# Patient Record
Sex: Male | Born: 1980 | Race: Black or African American | Hispanic: No | Marital: Single | Smoking: Current every day smoker
Health system: Southern US, Community
[De-identification: ages and names within clinical notes are randomized; demographics above are authoritative.]

## PROBLEM LIST (undated history)

## (undated) DIAGNOSIS — E119 Type 2 diabetes mellitus without complications: Secondary | ICD-10-CM

---

## 2014-06-21 ENCOUNTER — Emergency Department (HOSPITAL_COMMUNITY)
Admission: EM | Admit: 2014-06-21 | Discharge: 2014-06-22 | Disposition: A | Discharge status: Home / Self Care | Payer: Self-pay | Attending: Emergency Medicine | Admitting: Emergency Medicine

## 2014-06-21 ENCOUNTER — Encounter (HOSPITAL_COMMUNITY): Payer: Self-pay | Admitting: Emergency Medicine

## 2014-06-21 ENCOUNTER — Emergency Department (HOSPITAL_COMMUNITY): Payer: Self-pay

## 2014-06-21 DIAGNOSIS — Y9389 Activity, other specified: Secondary | ICD-10-CM | POA: Insufficient documentation

## 2014-06-21 DIAGNOSIS — W108XXA Fall (on) (from) other stairs and steps, initial encounter: Secondary | ICD-10-CM | POA: Insufficient documentation

## 2014-06-21 DIAGNOSIS — S63601A Unspecified sprain of right thumb, initial encounter: Secondary | ICD-10-CM | POA: Insufficient documentation

## 2014-06-21 DIAGNOSIS — Y9289 Other specified places as the place of occurrence of the external cause: Secondary | ICD-10-CM | POA: Insufficient documentation

## 2014-06-21 DIAGNOSIS — Z720 Tobacco use: Secondary | ICD-10-CM | POA: Insufficient documentation

## 2014-06-21 DIAGNOSIS — R040 Epistaxis: Secondary | ICD-10-CM | POA: Insufficient documentation

## 2014-06-21 DIAGNOSIS — Y998 Other external cause status: Secondary | ICD-10-CM | POA: Insufficient documentation

## 2014-06-21 DIAGNOSIS — S01511A Laceration without foreign body of lip, initial encounter: Secondary | ICD-10-CM | POA: Insufficient documentation

## 2014-06-21 MED ORDER — TRAMADOL HCL 50 MG PO TABS
50.0000 mg | ORAL_TABLET | Freq: Four times a day (QID) | ORAL | Status: AC | PRN
Start: 2014-06-21 — End: ?

## 2014-06-21 MED ORDER — OXYCODONE-ACETAMINOPHEN 5-325 MG PO TABS
1.0000 | ORAL_TABLET | Freq: Once | ORAL | Status: AC
  Administered 2014-06-21: 1 via ORAL
  Filled 2014-06-21: qty 1

## 2014-06-21 NOTE — ED Provider Notes (Signed)
CSN: [id number]     Arrival date & time 06/21/14  2126 History   First MD Initiated Contact with Patient 06/21/14 2211     Chief Complaint  Patient presents with  . Fall     (Consider location/radiation/quality/duration/timing/severity/associated sxs/prior Treatment) HPI Pt is a 34yo male brought to ED by girlfriend after pt reportedly tripped and fell down 20 concrete steps at apartment complex. Pt does report drinking alcohol tonight.  Denies passing out prior to trip and fall. Girlfriend states she believes pt lost consciousness as after pt fell down steps he hit his head on a door and did not move for several seconds.  Pt has been repeatedly asking questions to things girlfriend states pt should know.  Pt denies head, neck, back, chest, abdominal, arm or leg pain. Pt is c/o severe right thumb pain. Pt states he thinks he broke his right thumb. Denies nausea or vomiting.  Denies use of illicit drugs. Denies daily medications. No significant PMH.    History reviewed. No pertinent past medical history. History reviewed. No pertinent past surgical history. No family history on file. History  Substance Use Topics  . Smoking status: Current Every Day Smoker  . Smokeless tobacco: Not on file  . Alcohol Use: Yes    Review of Systems  Constitutional: Negative for fever and chills.  Respiratory: Negative for cough and shortness of breath.   Cardiovascular: Negative for chest pain and palpitations.  Gastrointestinal: Negative for nausea, vomiting and abdominal pain.  Musculoskeletal: Positive for myalgias and arthralgias. Negative for back pain, gait problem, neck pain and neck stiffness.       Right thumb pain  Skin: Positive for wound.  Neurological: Negative for dizziness, seizures, syncope, weakness, light-headedness, numbness and headaches.  All other systems reviewed and are negative.     Allergies  Review of patient's allergies indicates no known allergies.  Home Medications    Prior to Admission medications   Medication Sig Start Date End Date Taking? Authorizing Provider  traMADol (ULTRAM) 50 MG tablet Take 1 tablet (50 mg total) by mouth every 6 (six) hours as needed. 06/21/14   Junius FinnerErin O'Malley, PA-C   BP 125/86 mmHg  Pulse 109  Temp(Src) 97.9 F (36.6 C) (Oral)  Resp 24  Wt 230 lb (104.327 kg)  SpO2 100% Physical Exam  Constitutional: He is oriented to person, place, and time. He appears well-developed and well-nourished.  c-collar in place. NAD  HENT:  Head: Normocephalic. Head is with abrasion ( right cheek. no active bleeding) and with laceration ( right upper lip. scant dried blood.).  Nose: Epistaxis ( right nare, bleeding controlled) is observed.  Mouth/Throat: Uvula is midline, oropharynx is clear and moist and mucous membranes are normal.  Eyes: Conjunctivae are normal. No scleral icterus.  Neck: Normal range of motion. Neck supple.  No midline bone tenderness, no crepitus or step-offs.  Cardiovascular: Normal rate, regular rhythm and normal heart sounds.   Pulmonary/Chest: Effort normal and breath sounds normal. No respiratory distress. He has no wheezes. He has no rales. He exhibits no tenderness.  Abdominal: Soft. Bowel sounds are normal. He exhibits no distension and no mass. There is no tenderness. There is no rebound and no guarding.  Musculoskeletal: He exhibits tenderness. He exhibits no edema.  Right hand: no obvious deformity.  Tenderness over thenar muscle and proximal thumb. Unable to flex or extend thumb due to pain.  FROM index, middle, ring and little finger.   FROM right wrist.   No  midline spinal tenderness. FROM arms and legs.  Neurological: He is alert and oriented to person, place, and time. No cranial nerve deficit. Coordination normal. GCS eye subscore is 4. GCS verbal subscore is 5. GCS motor subscore is 6.  Alert and oriented to person, place, time and event. Speech is fluent. Normal coordination. No ataxia. 5/5 strength in  upper and lower extremities bilaterally.   Skin: Skin is warm and dry.  Nursing note and vitals reviewed.   ED Course  Procedures (including critical care time) Labs Review Labs Reviewed - No data to display  Imaging Review Ct Head Wo Contrast  06/21/2014   CLINICAL DATA:  fall down 20 concrete steps at apartment complex, right cheek laceration  EXAM: CT HEAD WITHOUT CONTRAST  CT CERVICAL SPINE WITHOUT CONTRAST  TECHNIQUE: Multidetector CT imaging of the head and cervical spine was performed following the standard protocol without intravenous contrast. Multiplanar CT image reconstructions of the cervical spine were also generated.  COMPARISON:  None.  FINDINGS: CT HEAD FINDINGS  No intracranial hemorrhage. No parenchymal contusion. No midline shift or mass effect. Basilar cisterns are patent. No skull base fracture. No fluid in the paranasal sinuses or mastoid air cells. Orbits are normal.  CT CERVICAL SPINE FINDINGS  No prevertebral soft tissue swelling. Normal alignment of cervical vertebral bodies. No loss of vertebral body height. Normal facet articulation. Normal craniocervical junction.  No evidence epidural or paraspinal hematoma.  IMPRESSION: 1. No intracranial trauma. 2. No cervical spine fracture   Electronically Signed   By: Genevive Bi M.D.   On: 06/21/2014 23:31   Ct Cervical Spine Wo Contrast  06/21/2014   CLINICAL DATA:  fall down 20 concrete steps at apartment complex, right cheek laceration  EXAM: CT HEAD WITHOUT CONTRAST  CT CERVICAL SPINE WITHOUT CONTRAST  TECHNIQUE: Multidetector CT imaging of the head and cervical spine was performed following the standard protocol without intravenous contrast. Multiplanar CT image reconstructions of the cervical spine were also generated.  COMPARISON:  None.  FINDINGS: CT HEAD FINDINGS  No intracranial hemorrhage. No parenchymal contusion. No midline shift or mass effect. Basilar cisterns are patent. No skull base fracture. No fluid in the  paranasal sinuses or mastoid air cells. Orbits are normal.  CT CERVICAL SPINE FINDINGS  No prevertebral soft tissue swelling. Normal alignment of cervical vertebral bodies. No loss of vertebral body height. Normal facet articulation. Normal craniocervical junction.  No evidence epidural or paraspinal hematoma.  IMPRESSION: 1. No intracranial trauma. 2. No cervical spine fracture   Electronically Signed   By: Genevive Bi M.D.   On: 06/21/2014 23:31   Dg Hand Complete Right  06/21/2014   CLINICAL DATA:  Larey Seat downstairs, right hand pain. Pain near the first digit  EXAM: RIGHT HAND - COMPLETE 3+ VIEW  COMPARISON:  None.  FINDINGS: No evidence of fracture of the carpal or metacarpal bones. Radiocarpal joint is intact. Phalanges are normal. No soft tissue injury.  IMPRESSION: No fracture or dislocation.   Electronically Signed   By: Genevive Bi M.D.   On: 06/21/2014 23:17     EKG Interpretation None      MDM   Final diagnoses:  Fall down stairs, initial encounter  Lip laceration, initial encounter  Thumb sprain, right, initial encounter   Pt is a 34yo male presenting to ED c/o right thumb pain after fall down 20 concrete steps at apartment complex.  Girlfriend reports pt lost consciousness and has had "memory lapses"  On exam, pt  is alert & oriented to person, place, time, and event. Denies head or neck pain, however, with reports of retrograde amnesia per girlfriend as well as reported MOI, CT head and cervical spine ordered. Right hand: tenderness to proximal thumb and thenar muscle. No snuff box tenderness. Plain films: no fracture or dislocation.   CT head and cervical spine: negative for acute injury. Pt hemodynamically stable for discharge home.  Wrist splint provided for right thumb sprain.   Home care instructions for lip laceration provided. Return precautions provided. Pt verbalized understanding and agreement with tx plan.     Junius Finner, PA-C 06/21/14 2347  Toy Baker, MD 06/24/14 [phone]

## 2014-06-21 NOTE — ED Notes (Signed)
Pt presents with girlfriend, per girlfriend she witnessed him trip and fall down 20 concrete steps at apartment complex. Girlfriend states he is having "memory lapses" abrasion noted to R cheek, laceration to upper lip, teeth intact but painful per patient, R thumb pain with abrasion. Pt A & O, philly collar placed in triage.

## 2014-08-27 ENCOUNTER — Encounter (HOSPITAL_COMMUNITY): Payer: Self-pay | Admitting: Emergency Medicine

## 2014-08-27 ENCOUNTER — Emergency Department (HOSPITAL_COMMUNITY)
Admission: EM | Admit: 2014-08-27 | Discharge: 2014-08-27 | Disposition: A | Discharge status: Home / Self Care | Payer: 59 | Attending: Emergency Medicine | Admitting: Emergency Medicine

## 2014-08-27 DIAGNOSIS — I10 Essential (primary) hypertension: Secondary | ICD-10-CM | POA: Diagnosis not present

## 2014-08-27 DIAGNOSIS — Z79899 Other long term (current) drug therapy: Secondary | ICD-10-CM | POA: Diagnosis not present

## 2014-08-27 DIAGNOSIS — E119 Type 2 diabetes mellitus without complications: Secondary | ICD-10-CM | POA: Diagnosis present

## 2014-08-27 DIAGNOSIS — Z720 Tobacco use: Secondary | ICD-10-CM | POA: Insufficient documentation

## 2014-08-27 HISTORY — DX: Type 2 diabetes mellitus without complications: E11.9

## 2014-08-27 LAB — COMPREHENSIVE METABOLIC PANEL
ALT: 14 U/L (ref 0–53)
ANION GAP: 7 (ref 5–15)
AST: 15 U/L (ref 0–37)
Albumin: 5 g/dL (ref 3.5–5.2)
Alkaline Phosphatase: 78 U/L (ref 39–117)
BUN: 10 mg/dL (ref 6–23)
CHLORIDE: 94 mmol/L — AB (ref 96–112)
CO2: 28 mmol/L (ref 19–32)
Calcium: 9.7 mg/dL (ref 8.4–10.5)
Creatinine, Ser: 1 mg/dL (ref 0.50–1.35)
Glucose, Bld: 479 mg/dL — ABNORMAL HIGH (ref 70–99)
POTASSIUM: 3.9 mmol/L (ref 3.5–5.1)
Sodium: 129 mmol/L — ABNORMAL LOW (ref 135–145)
Total Bilirubin: 0.7 mg/dL (ref 0.3–1.2)
Total Protein: 8.4 g/dL — ABNORMAL HIGH (ref 6.0–8.3)

## 2014-08-27 LAB — URINALYSIS, ROUTINE W REFLEX MICROSCOPIC
Bilirubin Urine: NEGATIVE
Glucose, UA: >1000 mg/dL — AB
Hgb urine dipstick: NEGATIVE
KETONES UR: NEGATIVE mg/dL
LEUKOCYTES UA: NEGATIVE
NITRITE: NEGATIVE
Protein, ur: NEGATIVE mg/dL
Specific Gravity, Urine: 1.042 — ABNORMAL HIGH (ref 1.005–1.030)
Urobilinogen, UA: 0.2 mg/dL (ref 0.0–1.0)
pH: 5.5 (ref 5.0–8.0)

## 2014-08-27 LAB — CBC WITH DIFFERENTIAL/PLATELET
BASOS ABS: 0 10*3/uL (ref 0.0–0.1)
Basophils Relative: 0 % (ref 0–1)
Eosinophils Absolute: 0.1 10*3/uL (ref 0.0–0.7)
Eosinophils Relative: 1 % (ref 0–5)
HEMATOCRIT: 45.7 % (ref 39.0–52.0)
Hemoglobin: 16.4 g/dL (ref 13.0–17.0)
Lymphocytes Relative: 31 % (ref 12–46)
Lymphs Abs: 1.6 10*3/uL (ref 0.7–4.0)
MCH: 28.2 pg (ref 26.0–34.0)
MCHC: 35.9 g/dL (ref 30.0–36.0)
MCV: 78.7 fL (ref 78.0–100.0)
Monocytes Absolute: 0.2 10*3/uL (ref 0.1–1.0)
Monocytes Relative: 4 % (ref 3–12)
NEUTROS ABS: 3.4 10*3/uL (ref 1.7–7.7)
NEUTROS PCT: 64 % (ref 43–77)
Platelets: 212 10*3/uL (ref 150–400)
RBC: 5.81 MIL/uL (ref 4.22–5.81)
RDW: 12.8 % (ref 11.5–15.5)
WBC: 5.3 10*3/uL (ref 4.0–10.5)

## 2014-08-27 LAB — URINE MICROSCOPIC-ADD ON

## 2014-08-27 LAB — CBG MONITORING, ED
GLUCOSE-CAPILLARY: 463 mg/dL — AB (ref 70–99)
GLUCOSE-CAPILLARY: 331 mg/dL — AB (ref 70–99)

## 2014-08-27 LAB — LIPASE, BLOOD: LIPASE: 19 U/L (ref 11–59)

## 2014-08-27 MED ORDER — SODIUM CHLORIDE 0.9 % IV BOLUS (SEPSIS)
1000.0000 mL | Freq: Once | INTRAVENOUS | Status: AC
  Administered 2014-08-27: 1000 mL via INTRAVENOUS

## 2014-08-27 MED ORDER — METFORMIN HCL 500 MG PO TABS: 500.0000 mg | ORAL_TABLET | Freq: Two times a day (BID) | ORAL | Status: AC

## 2014-08-27 MED ORDER — AMLODIPINE BESYLATE 5 MG PO TABS: 5.0000 mg | ORAL_TABLET | Freq: Every day | ORAL | Status: DC

## 2014-08-27 NOTE — ED Notes (Signed)
Pt sent from PCP for new diagnosis of diabetes. Given insulin at 1200. Has had polydipsia and polyuria at home. No n/v/d.

## 2014-08-27 NOTE — ED Provider Notes (Addendum)
CSN: [id number]     Arrival date & time 08/27/14  1316 History   First MD Initiated Contact with Patient 08/27/14 1551     Chief Complaint  Patient presents with  . Diabetes    HPI Comments: Pt has been noticing increased thirst and polyuria.  Pt has lost 40 lbs in the last year as well.  He was eating healthier but this weight was more than expected.  He has been drinking a lot of sugary drinks. He went to the doctors office today.  His blood sugar tested high.  He was given 12 units of humalog at the office.  20 minutes later the reading was still high so he was sent to the ED.  Patient is a 34 y.o. male presenting with diabetes problem. The history is provided by the patient.  Diabetes This is a new problem. The problem occurs constantly. Pertinent negatives include no chest pain, no abdominal pain, no headaches and no shortness of breath.    Past Medical History  Diagnosis Date  . Diabetes mellitus without complication    History reviewed. No pertinent past surgical history. History reviewed. No pertinent family history. History  Substance Use Topics  . Smoking status: Current Every Day Smoker  . Smokeless tobacco: Not on file  . Alcohol Use: Yes    Review of Systems  Respiratory: Negative for shortness of breath.   Cardiovascular: Negative for chest pain.  Gastrointestinal: Negative for abdominal pain.  Neurological: Negative for headaches.  All other systems reviewed and are negative.     Allergies  Review of patient's allergies indicates no known allergies.  Home Medications   Prior to Admission medications   Medication Sig Start Date End Date Taking? Authorizing Provider  ibuprofen (ADVIL,MOTRIN) 200 MG tablet Take 400 mg by mouth every 6 (six) hours as needed for headache or moderate pain.   Yes Historical Provider, MD  Multiple Vitamin (MULTIVITAMIN WITH MINERALS) TABS tablet Take 1 tablet by mouth daily.   Yes Historical Provider, MD  Tetrahydrozoline HCl  (VISINE OP) Apply 2 drops to eye daily as needed (dry eyes).   Yes Historical Provider, MD  amLODipine (NORVASC) 5 MG tablet Take 1 tablet (5 mg total) by mouth daily. 08/27/14   Linwood DibblesJon Barnaby Rippeon, MD  metFORMIN (GLUCOPHAGE) 500 MG tablet Take 1 tablet (500 mg total) by mouth 2 (two) times daily with a meal. 08/27/14   Linwood DibblesJon Dyane Broberg, MD  traMADol (ULTRAM) 50 MG tablet Take 1 tablet (50 mg total) by mouth every 6 (six) hours as needed. Patient not taking: Reported on 08/27/2014 06/21/14   Junius FinnerErin O'Malley, PA-C   BP 149/110 mmHg  Pulse 108  Temp(Src) 98.2 F (36.8 C) (Oral)  Resp 18  SpO2 98% Physical Exam  Constitutional: He appears well-developed and well-nourished. No distress.  HENT:  Head: Normocephalic and atraumatic.  Right Ear: External ear normal.  Left Ear: External ear normal.  Eyes: Conjunctivae are normal. Right eye exhibits no discharge. Left eye exhibits no discharge. No scleral icterus.  Neck: Neck supple. No tracheal deviation present.  Cardiovascular: Normal rate, regular rhythm and intact distal pulses.   Pulmonary/Chest: Effort normal and breath sounds normal. No stridor. No respiratory distress. He has no wheezes. He has no rales.  Abdominal: Soft. Bowel sounds are normal. He exhibits no distension. There is no tenderness. There is no rebound and no guarding.  Musculoskeletal: He exhibits no edema or tenderness.  Neurological: He is alert. He has normal strength. No cranial nerve deficit (  no facial droop, extraocular movements intact, no slurred speech) or sensory deficit. He exhibits normal muscle tone. He displays no seizure activity. Coordination normal.  Skin: Skin is warm and dry. No rash noted.  Psychiatric: He has a normal mood and affect.  Nursing note and vitals reviewed.   ED Course  Procedures (including critical care time) Labs Review Labs Reviewed  COMPREHENSIVE METABOLIC PANEL - Abnormal; Notable for the following:    Sodium 129 (*)    Chloride 94 (*)    Glucose,  Bld 479 (*)    Total Protein 8.4 (*)    All other components within normal limits  URINALYSIS, ROUTINE W REFLEX MICROSCOPIC - Abnormal; Notable for the following:    Specific Gravity, Urine 1.042 (*)    Glucose, UA >1000 (*)    All other components within normal limits  CBG MONITORING, ED - Abnormal; Notable for the following:    Glucose-Capillary 463 (*)    All other components within normal limits  CBG MONITORING, ED - Abnormal; Notable for the following:    Glucose-Capillary 331 (*)    All other components within normal limits  LIPASE, BLOOD  CBC WITH DIFFERENTIAL/PLATELET  URINE MICROSCOPIC-ADD ON    Medications  sodium chloride 0.9 % bolus 1,000 mL (1,000 mLs Intravenous New Bag/Given 08/27/14 1651)     MDM   Final diagnoses:  Essential hypertension  Type 2 diabetes mellitus without complication    Pt is feeling well without acute complaints.  He does have new onset diabetes.  PCP was going to start him on insulin but was concerned about his persistently elevated blood sugar.  I will consult with medical service to get their recommendations regarding outpatient treatment but pt does not need hospitalization at this time.  I discussed dietary modification and close follow up with PCP.  Will dc patient home on low dose norvasc and metformin bid.    Linwood Dibbles, MD 08/27/14 1802  Repeat BP was normal.  Will stop the norvasc.    Linwood Dibbles, MD 08/27/14 [phone]

## 2014-08-27 NOTE — Discharge Instructions (Signed)
Diabetes and Exercise Exercising regularly is important. It is not just about losing weight. It has many health benefits, such as:  Improving your overall fitness, flexibility, and endurance.  Increasing your bone density.  Helping with weight control.  Decreasing your body fat.  Increasing your muscle strength.  Reducing stress and tension.  Improving your overall health. People with diabetes who exercise gain additional benefits because exercise:  Reduces appetite.  Improves the body's use of blood sugar (glucose).  Helps lower or control blood glucose.  Decreases blood pressure.  Helps control blood lipids (such as cholesterol and triglycerides).  Improves the body's use of the hormone insulin by:  Increasing the body's insulin sensitivity.  Reducing the body's insulin needs.  Decreases the risk for heart disease because exercising:  Lowers cholesterol and triglycerides levels.  Increases the levels of good cholesterol (such as high-density lipoproteins [HDL]) in the body.  Lowers blood glucose levels. YOUR ACTIVITY PLAN  Choose an activity that you enjoy and set realistic goals. Your health care provider or diabetes educator can help you make an activity plan that works for you. Exercise regularly as directed by your health care provider. This includes:  Performing resistance training twice a week such as push-ups, sit-ups, lifting weights, or using resistance bands.  Performing 150 minutes of cardio exercises each week such as walking, running, or playing sports.  Staying active and spending no more than 90 minutes at one time being inactive. Even short bursts of exercise are good for you. Three 10-minute sessions spread throughout the day are just as beneficial as a single 30-minute session. Some exercise ideas include:  Taking the dog for a walk.  Taking the stairs instead of the elevator.  Dancing to your favorite song.  Doing an exercise  video.  Doing your favorite exercise with a friend. RECOMMENDATIONS FOR EXERCISING WITH TYPE 1 OR TYPE 2 DIABETES   Check your blood glucose before exercising. If blood glucose levels are greater than 240 mg/dL, check for urine ketones. Do not exercise if ketones are present.  Avoid injecting insulin into areas of the body that are going to be exercised. For example, avoid injecting insulin into:  The arms when playing tennis.  The legs when jogging.  Keep a record of:  Food intake before and after you exercise.  Expected peak times of insulin action.  Blood glucose levels before and after you exercise.  The type and amount of exercise you have done.  Review your records with your health care provider. Your health care provider will help you to develop guidelines for adjusting food intake and insulin amounts before and after exercising.  If you take insulin or oral hypoglycemic agents, watch for signs and symptoms of hypoglycemia. They include:  Dizziness.  Shaking.  Sweating.  Chills.  Confusion.  Drink plenty of water while you exercise to prevent dehydration or heat stroke. Body water is lost during exercise and must be replaced.  Talk to your health care provider before starting an exercise program to make sure it is safe for you. Remember, almost any type of activity is better than none. Document Released: 08/21/2003 Document Revised: 10/15/2013 Document Reviewed: 11/07/2012 Exit[employer] Patient Information 2015 [employer]. This information is not intended to replace advice given to you by your health care provider. Make sure you discuss any questions you have with your health care provider. Diabetes Mellitus and Food It is important for you to manage your blood sugar (glucose) level. Your blood glucose level can   be greatly affected by what you eat. Eating healthier foods in the appropriate amounts throughout the day at about the same time each day will help you  control your blood glucose level. It can also help slow or prevent worsening of your diabetes mellitus. Healthy eating may even help you improve the level of your blood pressure and reach or maintain a healthy weight.  HOW CAN FOOD AFFECT ME? Carbohydrates Carbohydrates affect your blood glucose level more than any other type of food. Your dietitian will help you determine how many carbohydrates to eat at each meal and teach you how to count carbohydrates. Counting carbohydrates is important to keep your blood glucose at a healthy level, especially if you are using insulin or taking certain medicines for diabetes mellitus. Alcohol Alcohol can cause sudden decreases in blood glucose (hypoglycemia), especially if you use insulin or take certain medicines for diabetes mellitus. Hypoglycemia can be a life-threatening condition. Symptoms of hypoglycemia (sleepiness, dizziness, and disorientation) are similar to symptoms of having too much alcohol.  If your health care provider has given you approval to drink alcohol, do so in moderation and use the following guidelines:  Women should not have more than one drink per day, and men should not have more than two drinks per day. One drink is equal to:  12 oz of beer.  5 oz of wine.  1 oz of hard liquor.  Do not drink on an empty stomach.  Keep yourself hydrated. Have water, diet soda, or unsweetened iced tea.  Regular soda, juice, and other mixers might contain a lot of carbohydrates and should be counted. WHAT FOODS ARE NOT RECOMMENDED? As you make food choices, it is important to remember that all foods are not the same. Some foods have fewer nutrients per serving than other foods, even though they might have the same number of calories or carbohydrates. It is difficult to get your body what it needs when you eat foods with fewer nutrients. Examples of foods that you should avoid that are high in calories and carbohydrates but low in nutrients  include:  Trans fats (most processed foods list trans fats on the Nutrition Facts label).  Regular soda.  Juice.  Candy.  Sweets, such as cake, pie, doughnuts, and cookies.  Fried foods. WHAT FOODS CAN I EAT? Have nutrient-rich foods, which will nourish your body and keep you healthy. The food you should eat also will depend on several factors, including:  The calories you need.  The medicines you take.  Your weight.  Your blood glucose level.  Your blood pressure level.  Your cholesterol level. You also should eat a variety of foods, including:  Protein, such as meat, poultry, fish, tofu, nuts, and seeds (lean animal proteins are best).  Fruits.  Vegetables.  Dairy products, such as milk, cheese, and yogurt (low fat is best).  Breads, grains, pasta, cereal, rice, and beans.  Fats such as olive oil, trans fat-free margarine, canola oil, avocado, and olives. DOES EVERYONE WITH DIABETES MELLITUS HAVE THE SAME MEAL PLAN? Because every person with diabetes mellitus is different, there is not one meal plan that works for everyone. It is very important that you meet with a dietitian who will help you create a meal plan that is just right for you. Document Released: 02/25/2005 Document Revised: 06/05/2013 Document Reviewed: 04/27/2013 Exit[employer] Patient Information 2015 [employer]. This information is not intended to replace advice given to you by your health care provider. Make sure you discuss any   questions you have with your health care provider.  

## 2014-08-27 NOTE — ED Notes (Signed)
[facility] [phone]). Pt is a new patient there, strong family hx of DM. Pt reports weight loss over past year, urine +1 ketones, CBG fasting reported "high".  Pt given 12 units humalog, 20 minutes later CBG still reporting "high".

## 2016-11-30 IMAGING — CR DG HAND COMPLETE 3+V*R*
3 series · 3 of 3 positions shown · non-contrast
Comparison: None.

CLINICAL DATA: Fell downstairs, right hand pain. Pain near the
first digit

EXAM:
RIGHT HAND - COMPLETE 3+ VIEW

[x hand pa right]
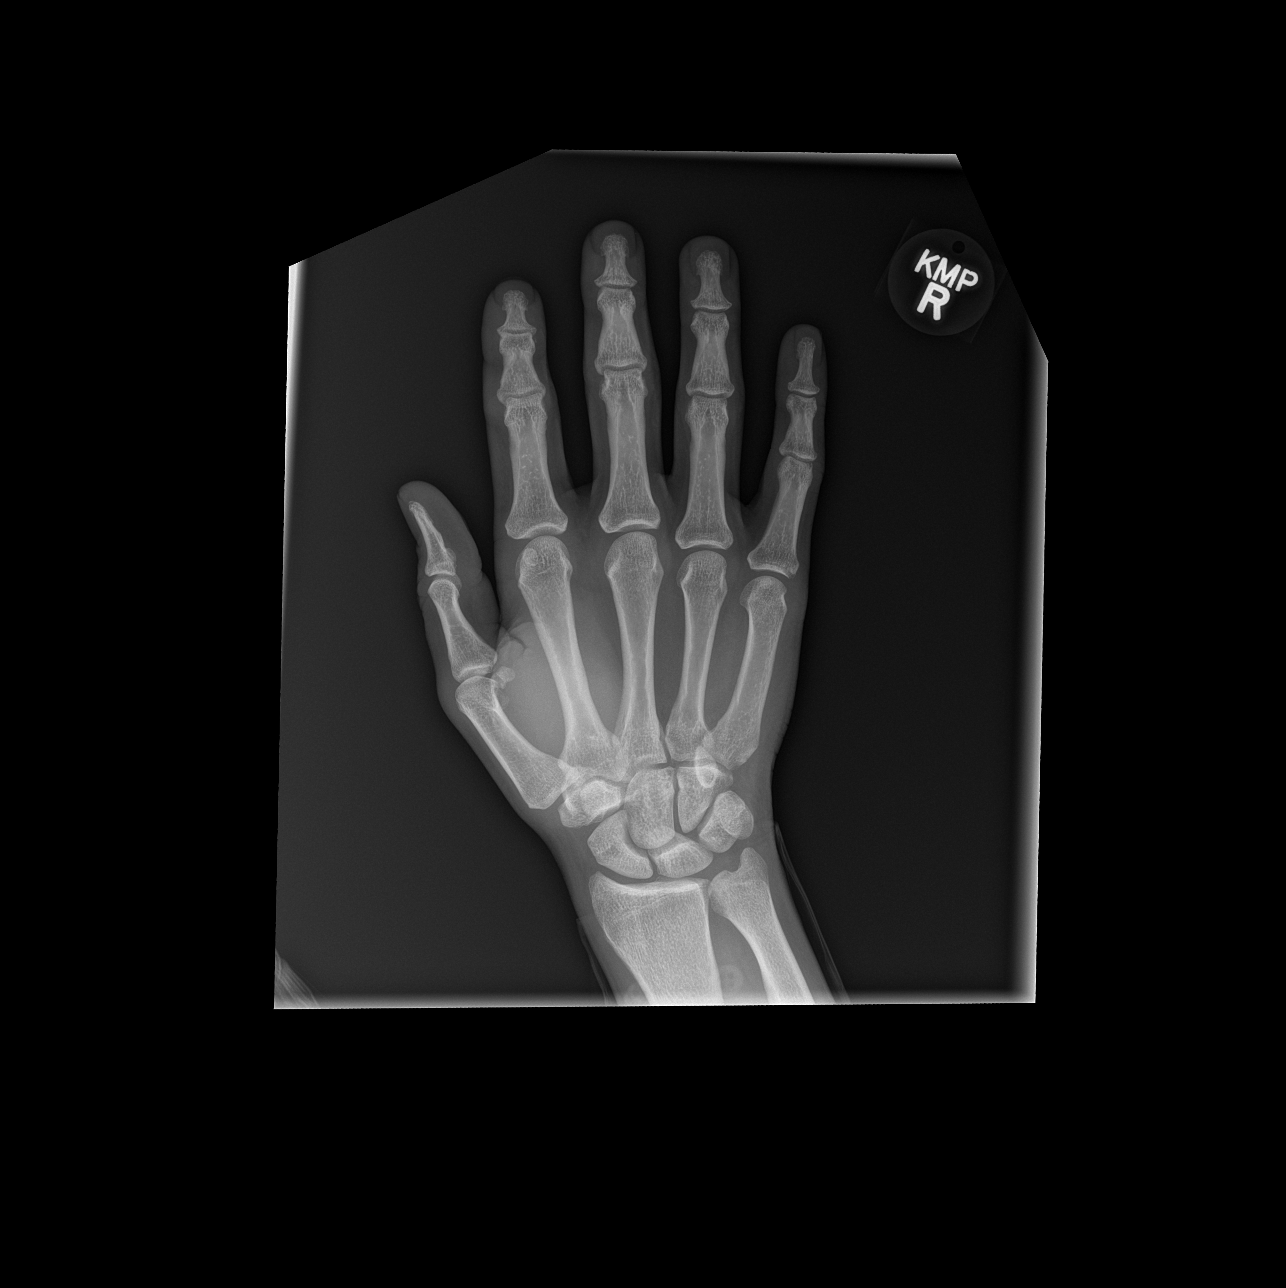

[x hand obl right]
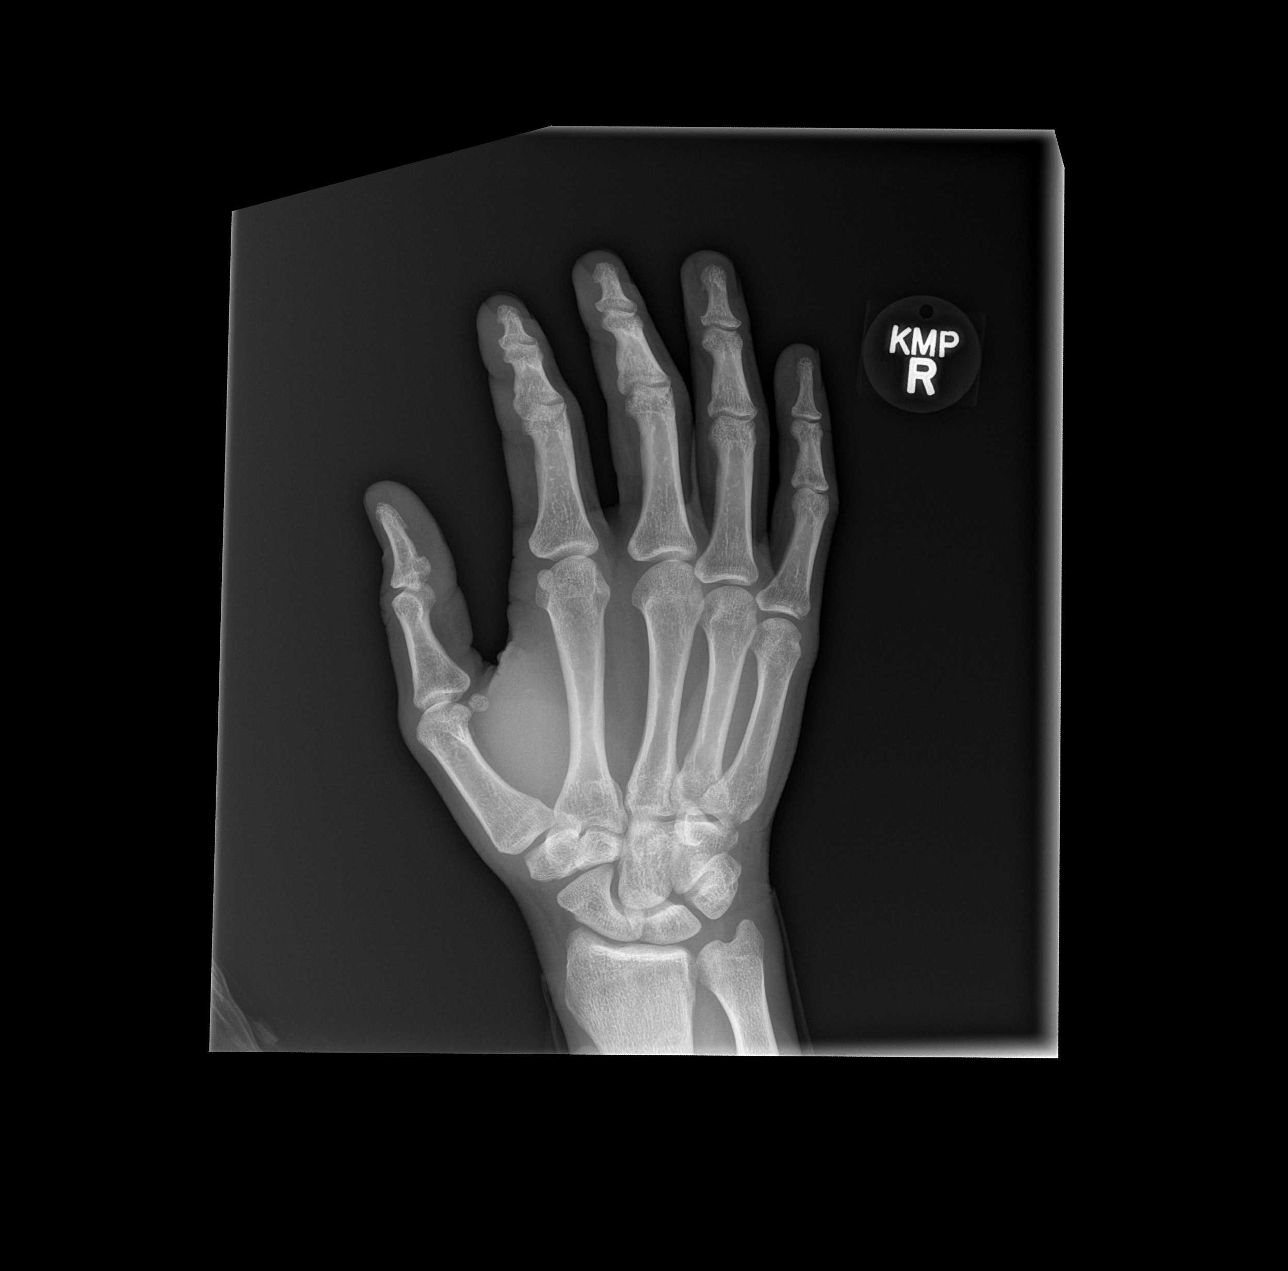

[x hand lat right]
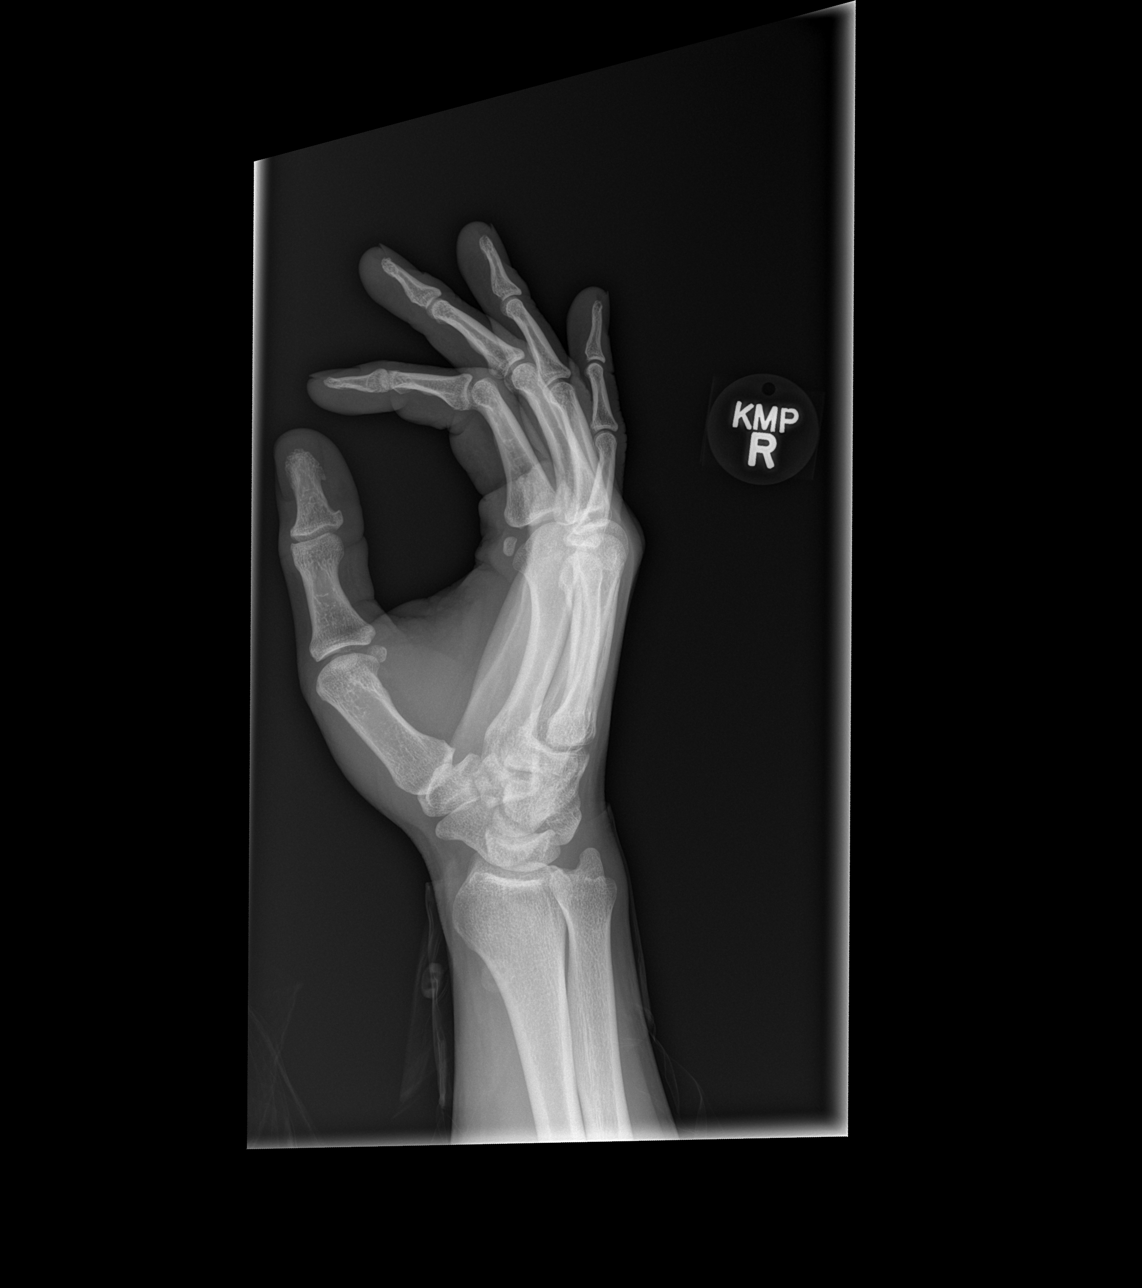

[3 of 3 positions shown; findings below may reference images not displayed]

FINDINGS: No evidence of fracture of the carpal or metacarpal bones.
Radiocarpal joint is intact. Phalanges are normal. No soft tissue
injury.
IMPRESSION: No fracture or dislocation.

## 2016-11-30 IMAGING — CT CT HEAD W/O CM
3 of 4 series · 16 of 30 positions shown, 19 images · non-contrast
Comparison: None.

CLINICAL DATA: fall down 20 concrete steps at apartment complex,
right cheek laceration

EXAM:
CT HEAD WITHOUT CONTRAST
CT CERVICAL SPINE WITHOUT CONTRAST
TECHNIQUE: Multidetector CT imaging of the head and cervical spine was
performed following the standard protocol without intravenous
contrast. Multiplanar CT image reconstructions of the cervical spine
were also generated.

[Series 4: bone windows · axial · 0.45mm/px · z∈[+1242,+1332]mm · 4 of 51 slices shown]
[im 11/51  bone]
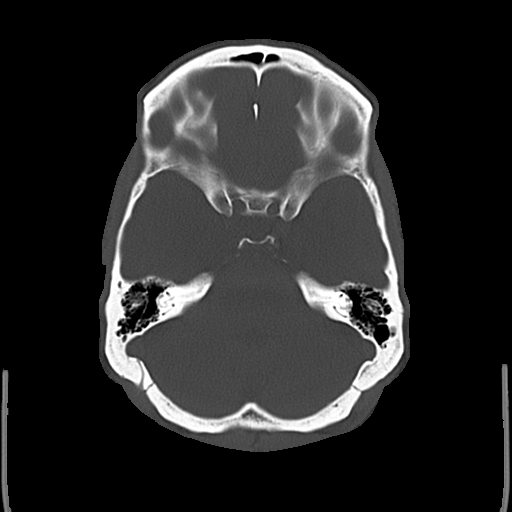
[im 21/51  bone]
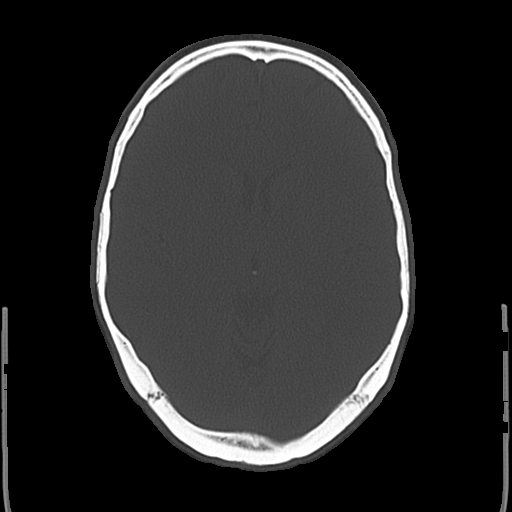
[im 31/51  bone]
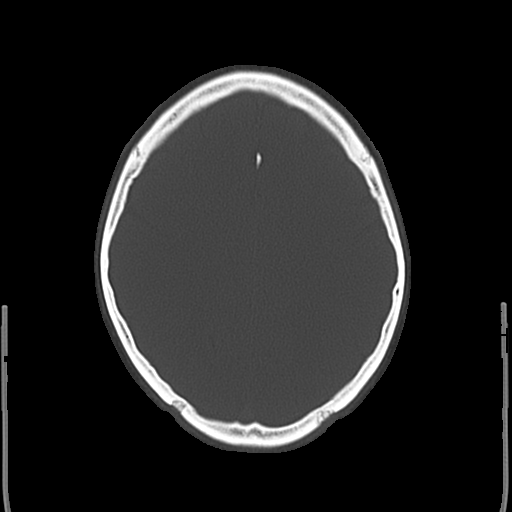
[im 41/51  bone]
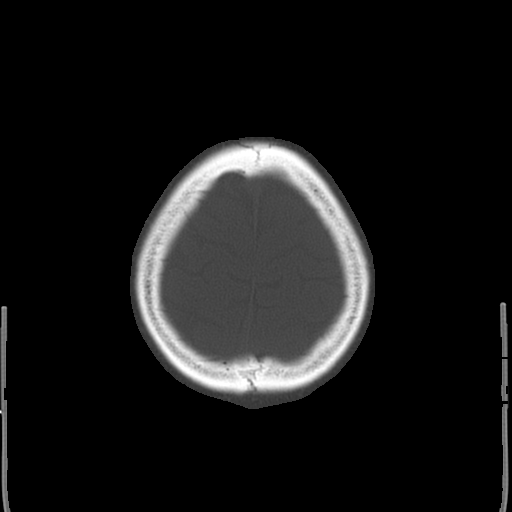

[Series 5: c-spine st · axial · 0.25mm/px · z∈[+1082,+1122]mm · 3 of 98 slices shown]
[im 10/98  brain]
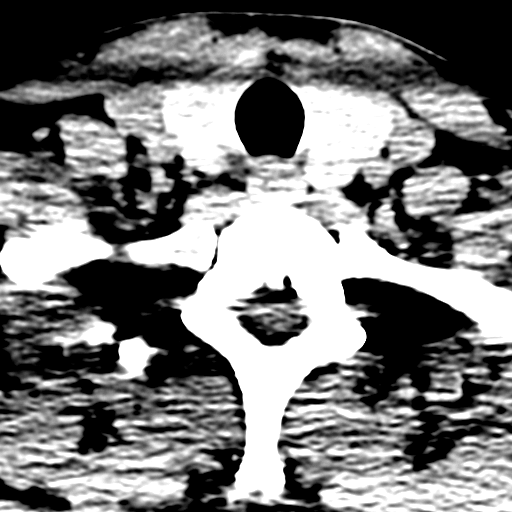
[im 20/98  brain]
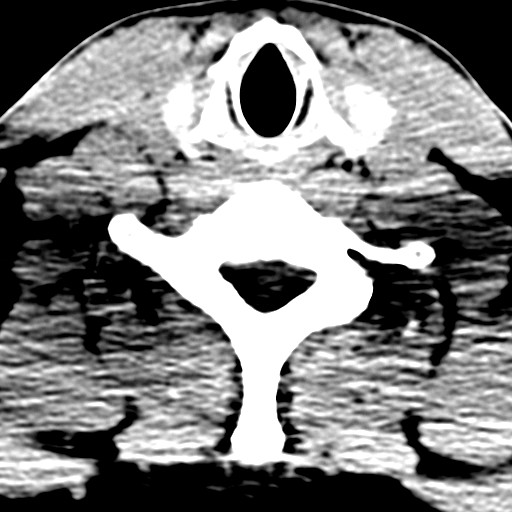
[im 30/98  brain]
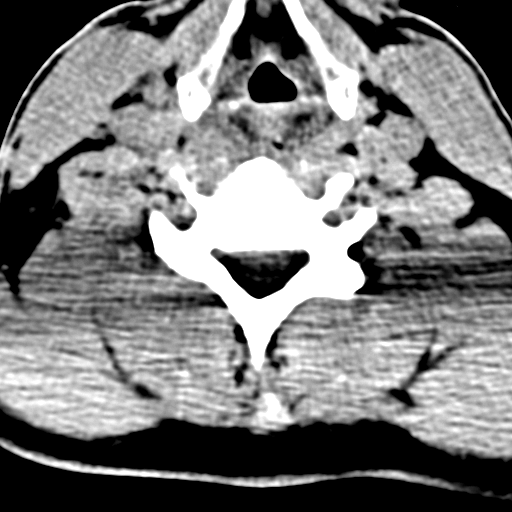

[Series 8: axial reformats · axial · 0.23mm/px · z∈[+1063,+1213]mm · 9 of 97 slices shown, 12 images]
[im 10/97  brain]
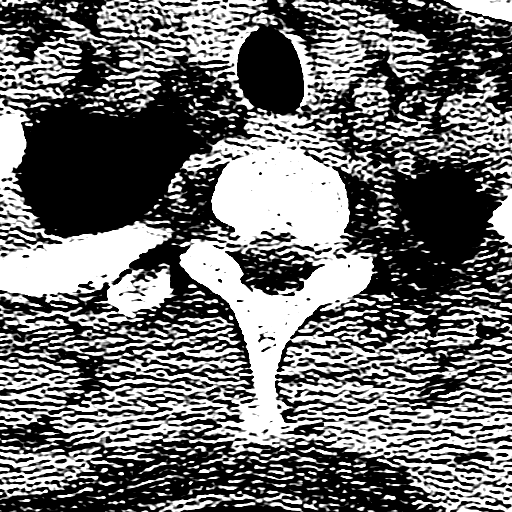
[im 10/97  bone]
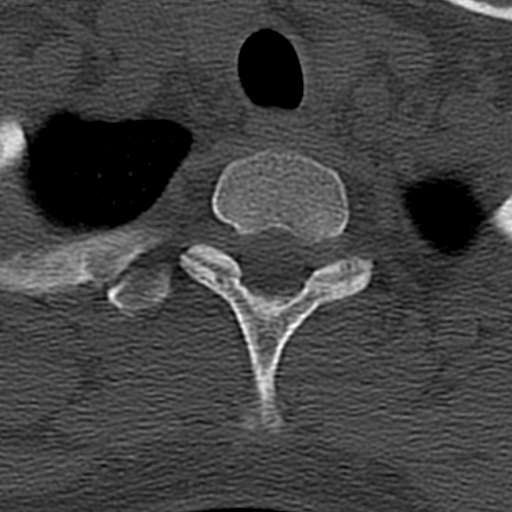
[im 20/97  brain]
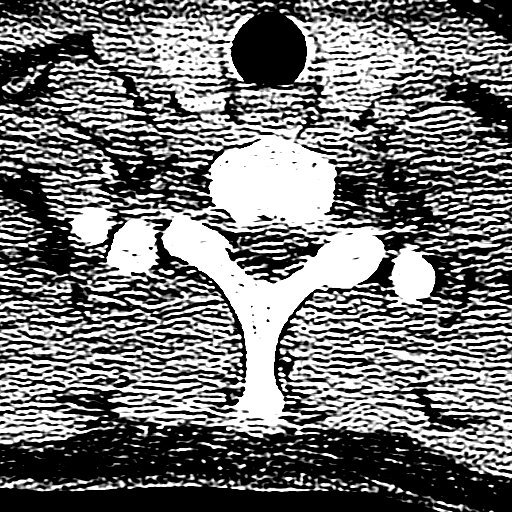
[im 29/97  brain]
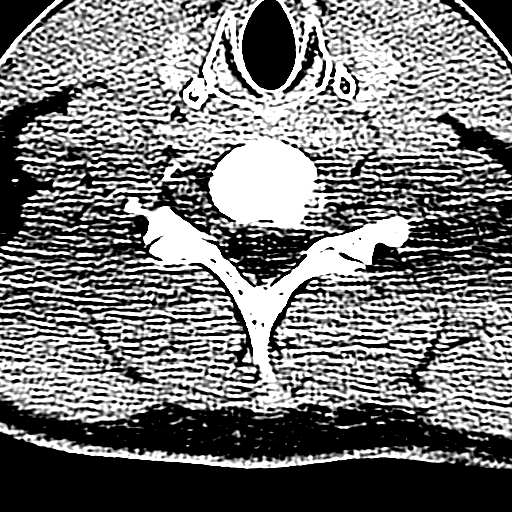
[im 39/97  brain]
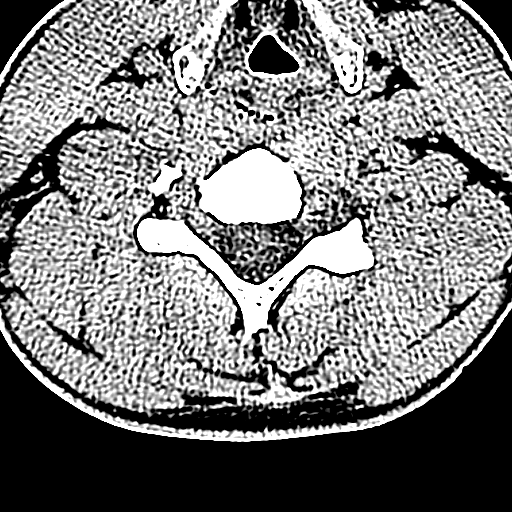
[im 49/97  brain]
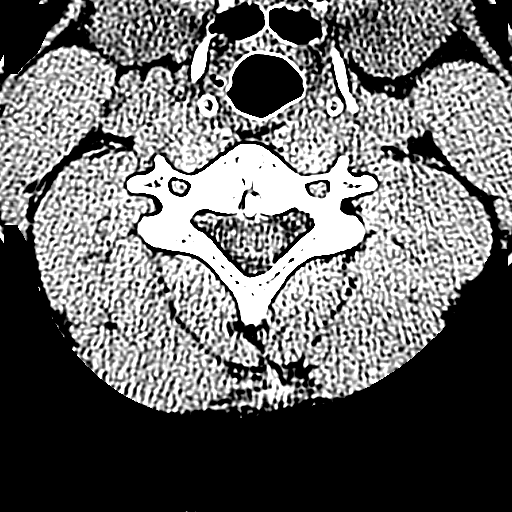
[im 49/97  bone]
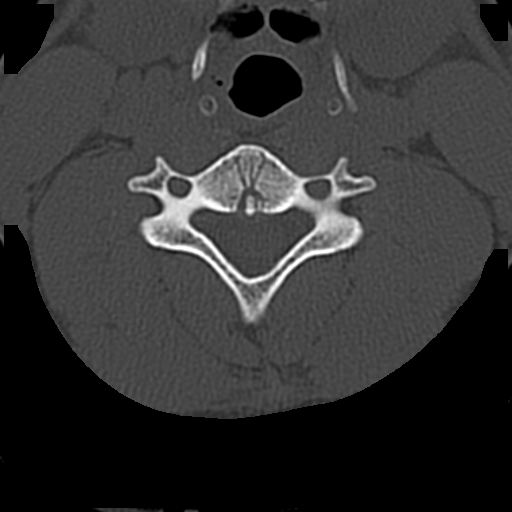
[im 58/97  brain]
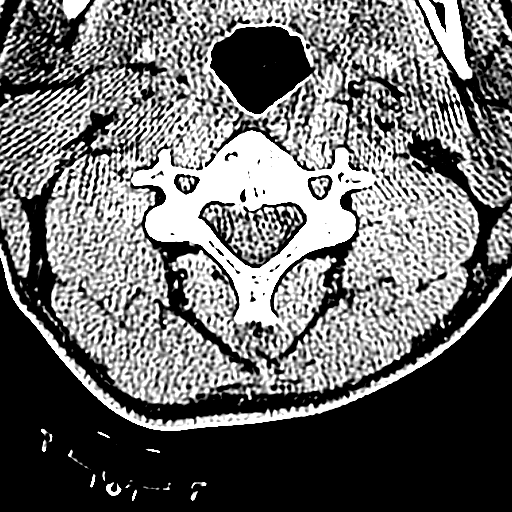
[im 68/97  brain]
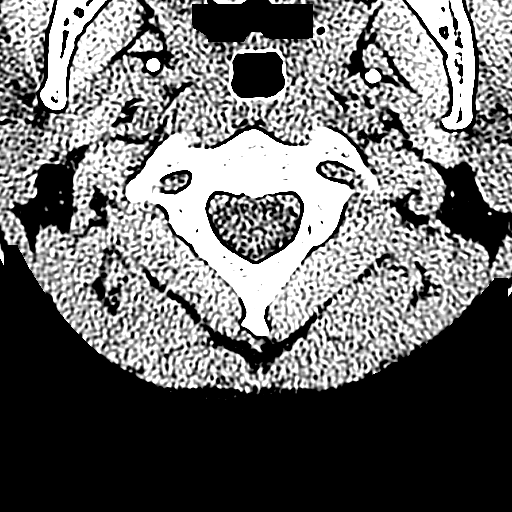
[im 77/97  brain]
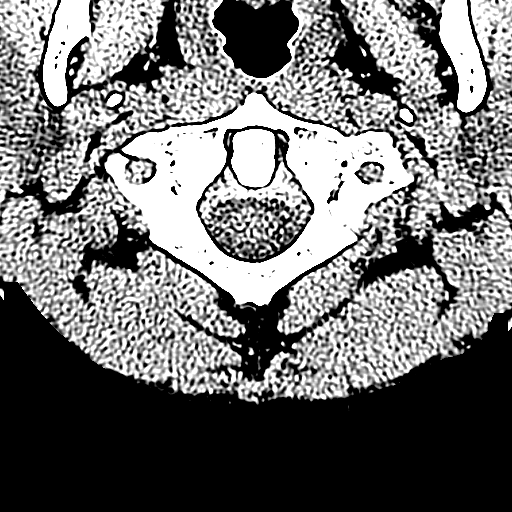
[im 87/97  brain]
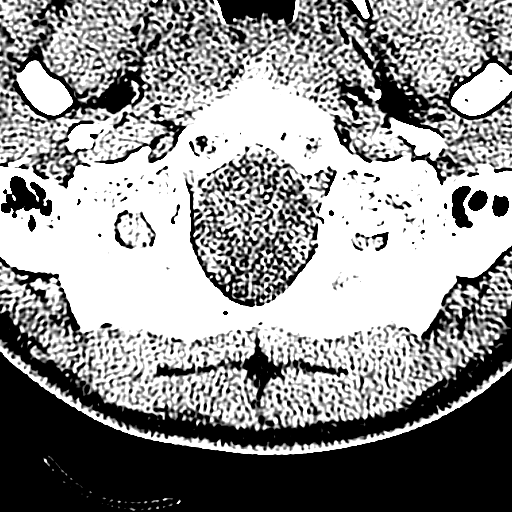
[im 87/97  bone]
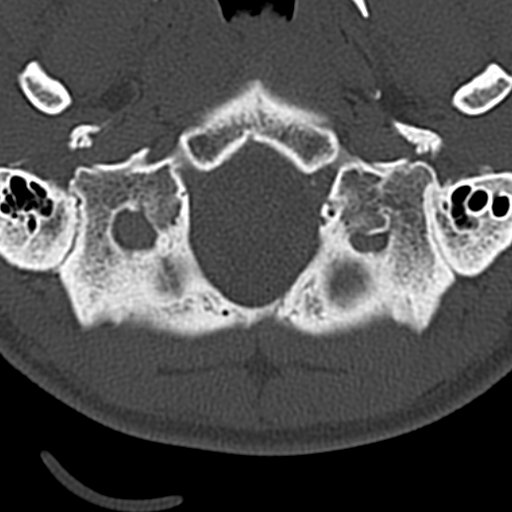

[16 of 30 positions shown; findings below may reference images not displayed]

FINDINGS: CT HEAD FINDINGS

No intracranial hemorrhage. No parenchymal contusion. No midline
shift or mass effect. Basilar cisterns are patent. No skull base
fracture. No fluid in the paranasal sinuses or mastoid air cells.
Orbits are normal.

CT CERVICAL SPINE FINDINGS

No prevertebral soft tissue swelling. Normal alignment of cervical
vertebral bodies. No loss of vertebral body height. Normal facet
articulation. Normal craniocervical junction.

No evidence epidural or paraspinal hematoma.
IMPRESSION: 1. No intracranial trauma.
2. No cervical spine fracture
